# Patient Record
Sex: Male | Born: 1960
Health system: Southern US, Community
[De-identification: ages and names within clinical notes are randomized; demographics above are authoritative.]

## PROBLEM LIST (undated history)

## (undated) DIAGNOSIS — I10 Essential (primary) hypertension: Secondary | ICD-10-CM

---

## 2019-10-01 ENCOUNTER — Other Ambulatory Visit: Payer: Self-pay

## 2019-10-01 ENCOUNTER — Emergency Department (HOSPITAL_COMMUNITY)
Admission: EM | Admit: 2019-10-01 | Discharge: 2019-10-01 | Disposition: A | Discharge status: Home / Self Care | Payer: Medicaid - Out of State | Attending: Emergency Medicine | Admitting: Emergency Medicine

## 2019-10-01 ENCOUNTER — Emergency Department (HOSPITAL_COMMUNITY): Payer: Medicaid - Out of State

## 2019-10-01 DIAGNOSIS — T50901A Poisoning by unspecified drugs, medicaments and biological substances, accidental (unintentional), initial encounter: Secondary | ICD-10-CM | POA: Insufficient documentation

## 2019-10-01 DIAGNOSIS — J189 Pneumonia, unspecified organism: Secondary | ICD-10-CM | POA: Insufficient documentation

## 2019-10-01 DIAGNOSIS — R4182 Altered mental status, unspecified: Secondary | ICD-10-CM | POA: Diagnosis present

## 2019-10-01 LAB — CBC
HCT: 45.4 % (ref 39.0–52.0)
Hemoglobin: 14.7 g/dL (ref 13.0–17.0)
MCH: 31.1 pg (ref 26.0–34.0)
MCHC: 32.4 g/dL (ref 30.0–36.0)
MCV: 96.2 fL (ref 80.0–100.0)
Platelets: 269 10*3/uL (ref 150–400)
RBC: 4.72 MIL/uL (ref 4.22–5.81)
RDW: 12.1 % (ref 11.5–15.5)
WBC: 9.6 10*3/uL (ref 4.0–10.5)
nRBC: 0 % (ref 0.0–0.2)

## 2019-10-01 LAB — COMPREHENSIVE METABOLIC PANEL
ALT: 13 U/L (ref 0–44)
AST: 16 U/L (ref 15–41)
Albumin: 3.9 g/dL (ref 3.5–5.0)
Alkaline Phosphatase: 66 U/L (ref 38–126)
Anion gap: 11 (ref 5–15)
BUN: 14 mg/dL (ref 6–20)
CO2: 24 mmol/L (ref 22–32)
Calcium: 9.5 mg/dL (ref 8.9–10.3)
Chloride: 103 mmol/L (ref 98–111)
Creatinine, Ser: 1.28 mg/dL — ABNORMAL HIGH (ref 0.61–1.24)
GFR calc Af Amer: >60 mL/min (ref >60)
GFR calc non Af Amer: >60 mL/min (ref >60)
Glucose, Bld: 163 mg/dL — ABNORMAL HIGH (ref 70–99)
Potassium: 3.7 mmol/L (ref 3.5–5.1)
Sodium: 138 mmol/L (ref 135–145)
Total Bilirubin: 0.5 mg/dL (ref 0.3–1.2)
Total Protein: 7.6 g/dL (ref 6.5–8.1)

## 2019-10-01 LAB — RAPID URINE DRUG SCREEN, HOSP PERFORMED
Amphetamines: NOT DETECTED
Barbiturates: NOT DETECTED
Benzodiazepines: NOT DETECTED
Cocaine: POSITIVE — AB
Opiates: NOT DETECTED
Tetrahydrocannabinol: POSITIVE — AB

## 2019-10-01 LAB — ETHANOL: Alcohol, Ethyl (B): <10 mg/dL (ref <10)

## 2019-10-01 MED ORDER — AMOXICILLIN-POT CLAVULANATE 875-125 MG PO TABS
1.0000 | ORAL_TABLET | Freq: Once | ORAL | Status: AC
  Administered 2019-10-01: 1 via ORAL
  Filled 2019-10-01: qty 1

## 2019-10-01 MED ORDER — AMOXICILLIN-POT CLAVULANATE 875-125 MG PO TABS: 1.0000 | ORAL_TABLET | Freq: Two times a day (BID) | ORAL | 0 refills | Status: DC

## 2019-10-01 MED ORDER — LORAZEPAM 2 MG/ML IJ SOLN
2.0000 mg | Freq: Once | INTRAMUSCULAR | Status: AC
  Administered 2019-10-01: 2 mg via INTRAVENOUS
  Filled 2019-10-01: qty 1

## 2019-10-01 MED ORDER — LACTATED RINGERS IV BOLUS
1000.0000 mL | Freq: Once | INTRAVENOUS | Status: AC
  Administered 2019-10-01: 1000 mL via INTRAVENOUS

## 2019-10-01 MED ORDER — SODIUM CHLORIDE 0.9 % IV BOLUS: 1000.0000 mL | Freq: Once | INTRAVENOUS | Status: DC

## 2019-10-01 NOTE — Discharge Instructions (Signed)
You have cocaine and marijuana in your system. Avoid doing drugs or alcohol   You have small pneumonia on your xray. Take augmentin twice daily for a week   See your doctor   Return to ER if you have trouble breathing, lethargy, thoughts of harming yourself or others.

## 2019-10-01 NOTE — ED Triage Notes (Addendum)
Pt arrived via [facility] from [facility] where staff reported pt acting abnormal. Pt admitted elicit drug use this morning at 0700 but was unsure what he took. Pt referred to the drugs as "dog food" and stated it was "multiple types of pills that were crushed together." Pt denied SI/HI. Pt visiting from [place] and states he was awaiting bus back to home. Pt is alert and able to answer questions appropriately. Pt is very active and unable to remain still for long. No aggression noted at this time. EMS reported HR of 140 on scene. Other V/s stable. Pt denies chest pain/SOB at time of triage.

## 2019-10-01 NOTE — ED Provider Notes (Signed)
  Physical Exam  BP 116/79   Pulse 93   Temp 98.6 F (37 C) (Oral)   Resp (!) 9   SpO2 96%   Physical Exam  ED Course/Procedures     Procedures  MDM  Patient care assumed at 3 pm. Patient took some white substance from someone else and was starting to be agitated.  Patient received some Ativan in the ED and UDS is pending and pending reassessment of mental status.  5:23 PM Briefly desat to 88% when sleeping. Now up and talking and awake. CXR showed possible pneumonia. Likely aspirated. Afebrile, WBC nl. Has no insurance. Will give augmentin empirically. Doesn't need admission as his oxygen is normal when he is awake now    Drenda Freeze, MD 10/01/19 1724

## 2019-10-01 NOTE — ED Provider Notes (Signed)
[facility] EMERGENCY DEPARTMENT Provider Note   CSN: [id number] Arrival date & time: 10/01/19  1233     History   Chief Complaint Chief Complaint  Patient presents with  . Altered Mental Status    HPI Matthew Sanders is a 58 y.o. male.     HPI  58 year old male presents with drug intoxication.  He states that at around 7 AM he was given different medicines that he crushed and ingested.  He does not know what they are.  He states he is not been eating and drinking well over the last couple days and has been doing drugs given to him by other people.  He is here visiting family and set to go back to [place].  EMS was called because he was acting strange.  The patient denies alcohol use.  Is vague about other drug use.  Denies any suicidal attempts.  No past medical history on file.  There are no active problems to display for this patient.         Home Medications    Prior to Admission medications   Not on File    Family History No family history on file.  Social History Social History   Tobacco Use  . Smoking status: Not on file  Substance Use Topics  . Alcohol use: Not on file  . Drug use: Not on file     Allergies   Patient has no allergy information on record.   Review of Systems Review of Systems  Respiratory: Negative for shortness of breath.   Cardiovascular: Negative for chest pain.  Gastrointestinal: Negative for vomiting.  Psychiatric/Behavioral: Negative for suicidal ideas.  All other systems reviewed and are negative.    Physical Exam Updated Vital Signs BP 97/85   Pulse (!) 111   Temp 98.6 F (37 C) (Oral)   Resp 10   SpO2 94%   Physical Exam Vitals signs and nursing note reviewed.  Constitutional:      Appearance: He is well-developed.     Comments: Patient is restless. Frequently gets up and moves around bed  HENT:     Head: Normocephalic and atraumatic.     Right Ear: External ear normal.     Left Ear:  External ear normal.     Nose: Nose normal.  Eyes:     General:        Right eye: No discharge.        Left eye: No discharge.  Neck:     Musculoskeletal: Neck supple.  Cardiovascular:     Rate and Rhythm: Regular rhythm. Tachycardia present.     Heart sounds: Normal heart sounds.  Pulmonary:     Effort: Pulmonary effort is normal.     Breath sounds: Normal breath sounds.  Abdominal:     Palpations: Abdomen is soft.     Tenderness: There is no abdominal tenderness.  Skin:    General: Skin is warm and dry.  Neurological:     Mental Status: He is alert and oriented to person, place, and time.     Comments: CN 3-12 grossly intact. 5/5 strength in all 4 extremities. Grossly normal sensation. Normal finger to nose.   Psychiatric:        Mood and Affect: Mood is anxious.      ED Treatments / Results  Labs (all labs ordered are listed, but only abnormal results are displayed) Labs Reviewed  COMPREHENSIVE METABOLIC PANEL - Abnormal; Notable for the following components:  Result Value   Glucose, Bld 163 (*)    Creatinine, Ser 1.28 (*)    All other components within normal limits  RAPID URINE DRUG SCREEN, HOSP PERFORMED - Abnormal; Notable for the following components:   Cocaine POSITIVE (*)    Tetrahydrocannabinol POSITIVE (*)    All other components within normal limits  ETHANOL  CBC  CBG MONITORING, ED    EKG EKG Interpretation  Date/Time:  Tuesday October 01 2019 12:46:51 EST Ventricular Rate:  94 PR Interval:    QRS Duration: 88 QT Interval:  360 QTC Calculation: 451 R Axis:   -42 Text Interpretation: Sinus rhythm Probable left atrial enlargement Abnormal R-wave progression, late transition Inferior infarct, old No old tracing to compare Confirmed by Pricilla Loveless [phone]) on 10/01/2019 1:04:49 PM   Radiology No results found.  Procedures Procedures (including critical care time)  Medications Ordered in ED Medications  LORazepam (ATIVAN) injection 2  mg (2 mg Intravenous Given 10/01/19 1339)  lactated ringers bolus 1,000 mL (1,000 mLs Intravenous New Bag/Given 10/01/19 1345)  lactated ringers bolus 1,000 mL (1,000 mLs Intravenous New Bag/Given 10/01/19 1342)     Initial Impression / Assessment and Plan / ED Course  I have reviewed the triage vital signs and the nursing notes.  Pertinent labs & imaging results that were available during my care of the patient were reviewed by me and considered in my medical decision making (see chart for details).        Patient is intoxicated.  While he is oriented to person, place, time and situation, he clearly is having some side effects of the drugs that he ingested this morning and possibly over the last couple days.  He will be given fluids and he was given IV Ativan.  His vital signs have improved with this though he is now asleep.  He will need reevaluation when sober, but given he is not truly altered or having focal neuro deficits, I do not think CT head is needed.  Care to Dr. Silverio Lay.  Final Clinical Impressions(s) / ED Diagnoses   Final diagnoses:  None    ED Discharge Orders    None       Pricilla Loveless, MD 10/01/19 1515

## 2019-10-01 NOTE — ED Notes (Signed)
Pt is calm, placed on [place] 4L after ativan

## 2019-10-01 NOTE — ED Notes (Signed)
Pt continues to sleep.  Pt continues to have 4L [place] as his spo2 is 88% on RA (? Due to low RR while sleeping?)

## 2019-10-17 ENCOUNTER — Emergency Department (HOSPITAL_COMMUNITY): Payer: Medicaid - Out of State

## 2019-10-17 ENCOUNTER — Emergency Department (HOSPITAL_COMMUNITY)
Admission: EM | Admit: 2019-10-17 | Discharge: 2019-10-17 | Disposition: A | Discharge status: Home / Self Care | Payer: Medicaid - Out of State | Attending: Emergency Medicine | Admitting: Emergency Medicine

## 2019-10-17 DIAGNOSIS — K429 Umbilical hernia without obstruction or gangrene: Secondary | ICD-10-CM

## 2019-10-17 DIAGNOSIS — G8929 Other chronic pain: Secondary | ICD-10-CM

## 2019-10-17 DIAGNOSIS — R030 Elevated blood-pressure reading, without diagnosis of hypertension: Secondary | ICD-10-CM

## 2019-10-17 DIAGNOSIS — M5442 Lumbago with sciatica, left side: Secondary | ICD-10-CM | POA: Diagnosis not present

## 2019-10-17 DIAGNOSIS — K6289 Other specified diseases of anus and rectum: Secondary | ICD-10-CM | POA: Diagnosis not present

## 2019-10-17 DIAGNOSIS — M545 Low back pain: Secondary | ICD-10-CM | POA: Diagnosis present

## 2019-10-17 LAB — COMPREHENSIVE METABOLIC PANEL
ALT: 11 U/L (ref 0–44)
AST: 13 U/L — ABNORMAL LOW (ref 15–41)
Albumin: 4.1 g/dL (ref 3.5–5.0)
Alkaline Phosphatase: 85 U/L (ref 38–126)
Anion gap: 11 (ref 5–15)
BUN: 20 mg/dL (ref 6–20)
CO2: 23 mmol/L (ref 22–32)
Calcium: 9.5 mg/dL (ref 8.9–10.3)
Chloride: 106 mmol/L (ref 98–111)
Creatinine, Ser: 1.07 mg/dL (ref 0.61–1.24)
GFR calc Af Amer: >60 mL/min (ref >60)
GFR calc non Af Amer: >60 mL/min (ref >60)
Glucose, Bld: 112 mg/dL — ABNORMAL HIGH (ref 70–99)
Potassium: 4.1 mmol/L (ref 3.5–5.1)
Sodium: 140 mmol/L (ref 135–145)
Total Bilirubin: 0.7 mg/dL (ref 0.3–1.2)
Total Protein: 7.9 g/dL (ref 6.5–8.1)

## 2019-10-17 LAB — CBC WITH DIFFERENTIAL/PLATELET
Abs Immature Granulocytes: 0.03 10*3/uL (ref 0.00–0.07)
Basophils Absolute: 0.1 10*3/uL (ref 0.0–0.1)
Basophils Relative: 1 %
Eosinophils Absolute: 0.1 10*3/uL (ref 0.0–0.5)
Eosinophils Relative: 2 %
HCT: 47.5 % (ref 39.0–52.0)
Hemoglobin: 16.2 g/dL (ref 13.0–17.0)
Immature Granulocytes: 0 %
Lymphocytes Relative: 26 %
Lymphs Abs: 2.3 10*3/uL (ref 0.7–4.0)
MCH: 31.6 pg (ref 26.0–34.0)
MCHC: 34.1 g/dL (ref 30.0–36.0)
MCV: 92.6 fL (ref 80.0–100.0)
Monocytes Absolute: 0.6 10*3/uL (ref 0.1–1.0)
Monocytes Relative: 7 %
Neutro Abs: 5.5 10*3/uL (ref 1.7–7.7)
Neutrophils Relative %: 64 %
Platelets: 267 10*3/uL (ref 150–400)
RBC: 5.13 MIL/uL (ref 4.22–5.81)
RDW: 12.5 % (ref 11.5–15.5)
WBC: 8.7 10*3/uL (ref 4.0–10.5)
nRBC: 0 % (ref 0.0–0.2)

## 2019-10-17 LAB — LIPASE, BLOOD: Lipase: 29 U/L (ref 11–51)

## 2019-10-17 MED ORDER — HYDROXYZINE HCL 25 MG PO TABS
25.0000 mg | ORAL_TABLET | Freq: Once | ORAL | Status: AC
  Administered 2019-10-17: 25 mg via ORAL
  Filled 2019-10-17: qty 1

## 2019-10-17 MED ORDER — MELOXICAM 7.5 MG PO TABS: 7.5000 mg | ORAL_TABLET | Freq: Every day | ORAL | 0 refills | Status: AC

## 2019-10-17 MED ORDER — LIDOCAINE 5 % EX PTCH
2.0000 | MEDICATED_PATCH | CUTANEOUS | Status: DC
  Administered 2019-10-17: 10:00:00 2 via TRANSDERMAL
  Filled 2019-10-17: qty 2

## 2019-10-17 MED ORDER — IOHEXOL 350 MG/ML SOLN
100.0000 mL | Freq: Once | INTRAVENOUS | Status: AC | PRN
  Administered 2019-10-17: 100 mL via INTRAVENOUS

## 2019-10-17 MED ORDER — HYDROCODONE-ACETAMINOPHEN 5-325 MG PO TABS
1.0000 | ORAL_TABLET | Freq: Once | ORAL | Status: AC
  Administered 2019-10-17: 1 via ORAL
  Filled 2019-10-17: qty 1

## 2019-10-17 MED ORDER — METHOCARBAMOL 500 MG PO TABS: 500.0000 mg | ORAL_TABLET | Freq: Two times a day (BID) | ORAL | 0 refills | Status: AC

## 2019-10-17 NOTE — ED Notes (Signed)
Patient transported to X-ray 

## 2019-10-17 NOTE — Progress Notes (Signed)
  [place] Medication Assistance Card  Name: Matthew Sanders  ID (MRN): [id number] [place]: [id number] RX Group: [id number]  Discharge Date:10/17/2019  5:18 PM  Expiration Date:10/25/2019  (must be filled within 7 days of discharge)     Dear [name]:  You have been approved to have the prescriptions written by your discharging physician filled through our [facility] (Medication Assistance Through [facility]) program. This program allows for a one-time (no refills) 34-day supply of selected medications for a low copay amount.  The copay is $3.00 per prescription. For instance, if you have one prescription, you will pay $3.00; for two prescriptions, you pay $6.00; for three prescriptions, you pay $9.00; and so on.  Only certain pharmacies are participating in this program with [facility]. You will need to select one of the pharmacies from the attached list and take your prescriptions, this letter, and your photo ID to one of the participating pharmacies.   We are excited that you are able to use the [facility] program to get your medications. These prescriptions must be filled within 7 days of hospital discharge or they will no longer be valid for the [facility] program. Should you have any problems with your prescriptions please contact your case management team member at [phone] for [name],  Lizzett Nobile CM [place]

## 2019-10-17 NOTE — Discharge Instructions (Addendum)
As discussed, your imaging today showed a hernia and rectal mass. I have included 3 doctor numbers in the paperwork for further evaluation of your low back pain, hernia, and rectal mass. Call tomorrow to schedule an appointment at each of these placed for further workup. I am sending you home with meloxicam which is a pain medication. You may take it once a day as needed for pain. Do not mix with other over the counter medications. I am also sending you home with Robaxin which is a muscle relaxer. The medication can cause drowsiness, so do not drive or operate machinery while on this medication. You have an appointment schedule for a PCP on 1/13 at 9:30am. Information of the location is in this packet. Return to the ER for new or worsening symptoms.   Also, your blood pressure was elevated today. Please get your blood pressure rechecked at your schedule PCP appointment. You may need to be placed on high blood pressure medication

## 2019-10-17 NOTE — ED Triage Notes (Signed)
Pt to ER - requesting xray of his mid-thoracic back for social security reasons. He reports has had this pain for years, no changes recently. He is ambulatory with cane at baseline. He is a/o x4. NAD at triage.

## 2019-10-17 NOTE — ED Notes (Signed)
Patient transported to CT 

## 2019-10-17 NOTE — Progress Notes (Addendum)
TOC CM spoke to pt and states he is currently homeless. Will provide M[facility] with $0 copay for his meds. Explained to pt that he can use once per year. Appt arranged at [facility] on 11/13/2019 at 9:30 am. Pt states he uses public transportation. Will provide pt with information on DSS to have his Medicaid changed to [place]. States he has pending disability applicaton. Jonnie Finner RN CCM, [facility] ED TOC CM [phone]  Faxed MATCH to [place]. Jonnie Finner RN CCM, [facility] ED TOC CM [phone]  10/18/2019 316 pm Pt picked up medications. Explained the importance of keeping his follow up appt. [place], [place] ED TOC CM [phone]

## 2019-10-17 NOTE — ED Notes (Signed)
Pt given food and beverage per [place], [place]
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
## 2019-10-17 NOTE — Progress Notes (Signed)
CSW received a call from pt's EDP stating pt is living in a shelter currently, is from [place] and is new to the area.  Per EDP, pt has significant health issues and may need follow up assistance with appts, etc, per the EDP.  CSW to request assistance from [facility] RN CM.  CSW will continue to follow for D/C needs.  Alphonse Guild. Levoy Geisen, LCSW, LCAS, CSI Transitions of Care Clinical Social Worker Care Coordination Department Ph: [phone]

## 2019-10-17 NOTE — ED Notes (Signed)
Patient verbalizes understanding of discharge instructions. Opportunity for questioning and answers were provided. Pt discharged from ED. 

## 2019-10-17 NOTE — ED Notes (Signed)
At pt's request, [facility] notified that pt would be discharged and returning to facility this evening

## 2019-10-17 NOTE — ED Notes (Signed)
At pt's request, [facility] to notify them that he is in ED

## 2019-10-18 ENCOUNTER — Encounter (HOSPITAL_COMMUNITY): Payer: Self-pay | Admitting: Student

## 2019-10-23 ENCOUNTER — Encounter: Payer: Self-pay | Admitting: Critical Care Medicine

## 2019-10-23 ENCOUNTER — Other Ambulatory Visit: Payer: Self-pay | Admitting: Critical Care Medicine

## 2019-10-23 ENCOUNTER — Encounter: Payer: Self-pay | Admitting: Physician Assistant

## 2019-10-23 DIAGNOSIS — K922 Gastrointestinal hemorrhage, unspecified: Secondary | ICD-10-CM

## 2019-10-23 DIAGNOSIS — K649 Unspecified hemorrhoids: Secondary | ICD-10-CM

## 2019-10-23 DIAGNOSIS — K429 Umbilical hernia without obstruction or gangrene: Secondary | ICD-10-CM

## 2019-10-23 MED ORDER — HYDROCORTISONE ACETATE 25 MG RE SUPP: 25.0000 mg | Freq: Two times a day (BID) | RECTAL | 0 refills | Status: AC

## 2019-10-23 MED FILL — HYDROCORTISONE ACETATE 25 M: 25 | 12 days supply | Qty: 24 | Fill #0

## 2019-10-23 NOTE — Progress Notes (Signed)
anusol for hemorrhoids
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
## 2019-10-30 ENCOUNTER — Encounter: Payer: Self-pay | Admitting: Critical Care Medicine

## 2019-10-31 NOTE — Progress Notes (Signed)
Patient ID: Randolf Sansoucie, male   DOB: Jul 11, 1961, 58 y.o.   MRN: [id number] Connected with this patient but seen a week ago in the [place]  cl[facility].  This is a 58 year old male whose had issues of pain and bleeding in the rectal area.  A CT scan recently was done which showed shouldering effect in the rectosigmoid area.  On my exam previously there were no external hemorrhoids visible.  He was received an Anusol suppository which I gave him last week he states this is helped significantly.  Blood pressure today 130/96.  The patient states his low back pain is improved he does complain of some hand numbness.  He still has the meloxicam and the Robaxin which he takes as needed.  I had made an appointment for this patient at [facility] surgery and they are going to reach out to the patient I told the patient to be on the look out for phone call for an upcoming appointment with [facility] surgery with Dr. Ninfa Linden

## 2019-11-13 ENCOUNTER — Ambulatory Visit (INDEPENDENT_AMBULATORY_CARE_PROVIDER_SITE_OTHER): Payer: Medicaid - Out of State | Admitting: Primary Care

## 2019-11-13 ENCOUNTER — Encounter (INDEPENDENT_AMBULATORY_CARE_PROVIDER_SITE_OTHER): Payer: Self-pay | Admitting: Primary Care

## 2019-11-13 ENCOUNTER — Other Ambulatory Visit: Payer: Self-pay

## 2019-11-13 DIAGNOSIS — K4030 Unilateral inguinal hernia, with obstruction, without gangrene, not specified as recurrent: Secondary | ICD-10-CM

## 2019-11-13 DIAGNOSIS — Z7689 Persons encountering health services in other specified circumstances: Secondary | ICD-10-CM

## 2019-11-13 NOTE — Progress Notes (Signed)
Virtual Visit via Telephone Note  I connected with Matthew Sanders on 11/13/19 at  9:30 AM EST by telephone and verified that I am speaking with the correct person using two identifiers.   I discussed the limitations, risks, security and privacy concerns of performing an evaluation and management service by telephone and the availability of in person appointments. I also discussed with the patient that there may be a patient responsible charge related to this service. The patient expressed understanding and agreed to proceed.   History of Present Illness: Matthew Sanders is having tele visit to establish care, he has a hernia that is painful and would like something done. He is very emotional death in family he was talking back and fourth from hernia to the grand kids finding grandma died because the fire alarm went off biscuits she was baking was burning than to pain.  No past medical history on file.  Current Outpatient Medications on File Prior to Visit  Medication Sig Dispense Refill  . hydrocortisone (ANUSOL-HC) 25 MG suppository Place 1 suppository (25 mg total) rectally 2 (two) times daily. 24 suppository 0  . meloxicam (MOBIC) 7.5 MG tablet Take 1 tablet (7.5 mg total) by mouth daily. 15 tablet 0  . methocarbamol (ROBAXIN) 500 MG tablet Take 1 tablet (500 mg total) by mouth 2 (two) times daily. 20 tablet 0   No current facility-administered medications on file prior to visit.     Observations/Objective: Review of Systems  Gastrointestinal: Positive for abdominal pain.  All other systems reviewed and are negative.    Assessment and Plan: Matthew Sanders was seen today for hospitalization follow-up.  Diagnoses and all orders for this visit:  Encounter to establish care Gwinda Passe, NP-C will be your  (PCP) mastered prepared that is able to that will  diagnosed and treatment able to answer health concern as well as continuing care of varied medical conditions, not limited by cause,  organ system, or diagnosis.   Inguinal hernia with obstruction without gangrene, recurrence not specified, unspecified laterality Patient states he was working out lifting weights when he notices bulging in his stomach. Previous documentation refer to diagnosis as umbilical hernia Refer to general surgery to evaluate and treat.    Follow Up Instructions:    I discussed the assessment and treatment plan with the patient. The patient was provided an opportunity to ask questions and all were answered. The patient agreed with the plan and demonstrated an understanding of the instructions.   The patient was advised to call back or seek an in-person evaluation if the symptoms worsen or if the condition fails to improve as anticipated.  I provided 22 minutes of non-face-to-face time during this encounter. Includes reading previous notes, labs and imaging   Grayce Sessions, NP

## 2019-11-14 ENCOUNTER — Ambulatory Visit: Payer: Medicaid - Out of State | Admitting: Physician Assistant

## 2020-02-18 ENCOUNTER — Emergency Department (HOSPITAL_COMMUNITY): Payer: Medicaid Other

## 2020-02-18 ENCOUNTER — Encounter (HOSPITAL_COMMUNITY): Payer: Self-pay | Admitting: Emergency Medicine

## 2020-02-18 ENCOUNTER — Other Ambulatory Visit: Payer: Self-pay

## 2020-02-18 ENCOUNTER — Emergency Department (HOSPITAL_COMMUNITY)
Admission: EM | Admit: 2020-02-18 | Discharge: 2020-02-18 | Disposition: A | Discharge status: Home / Self Care | Payer: Medicaid Other | Attending: Emergency Medicine | Admitting: Emergency Medicine

## 2020-02-18 DIAGNOSIS — K429 Umbilical hernia without obstruction or gangrene: Secondary | ICD-10-CM | POA: Diagnosis not present

## 2020-02-18 DIAGNOSIS — S20211A Contusion of right front wall of thorax, initial encounter: Secondary | ICD-10-CM | POA: Diagnosis not present

## 2020-02-18 DIAGNOSIS — R0789 Other chest pain: Secondary | ICD-10-CM | POA: Diagnosis present

## 2020-02-18 DIAGNOSIS — Y999 Unspecified external cause status: Secondary | ICD-10-CM | POA: Diagnosis not present

## 2020-02-18 DIAGNOSIS — J90 Pleural effusion, not elsewhere classified: Secondary | ICD-10-CM | POA: Diagnosis not present

## 2020-02-18 DIAGNOSIS — W109XXA Fall (on) (from) unspecified stairs and steps, initial encounter: Secondary | ICD-10-CM | POA: Insufficient documentation

## 2020-02-18 DIAGNOSIS — G8911 Acute pain due to trauma: Secondary | ICD-10-CM | POA: Diagnosis not present

## 2020-02-18 DIAGNOSIS — Y9389 Activity, other specified: Secondary | ICD-10-CM | POA: Insufficient documentation

## 2020-02-18 DIAGNOSIS — R1033 Periumbilical pain: Secondary | ICD-10-CM | POA: Diagnosis not present

## 2020-02-18 DIAGNOSIS — Y929 Unspecified place or not applicable: Secondary | ICD-10-CM | POA: Insufficient documentation

## 2020-02-18 HISTORY — DX: Essential (primary) hypertension: I10

## 2020-02-18 LAB — COMPREHENSIVE METABOLIC PANEL
ALT: 13 U/L (ref 0–44)
AST: 14 U/L — ABNORMAL LOW (ref 15–41)
Albumin: 4.1 g/dL (ref 3.5–5.0)
Alkaline Phosphatase: 115 U/L (ref 38–126)
Anion gap: 10 (ref 5–15)
BUN: 13 mg/dL (ref 6–20)
CO2: 23 mmol/L (ref 22–32)
Calcium: 9.5 mg/dL (ref 8.9–10.3)
Chloride: 103 mmol/L (ref 98–111)
Creatinine, Ser: 0.76 mg/dL (ref 0.61–1.24)
GFR calc Af Amer: >60 mL/min (ref >60)
GFR calc non Af Amer: >60 mL/min (ref >60)
Glucose, Bld: 125 mg/dL — ABNORMAL HIGH (ref 70–99)
Potassium: 3.7 mmol/L (ref 3.5–5.1)
Sodium: 136 mmol/L (ref 135–145)
Total Bilirubin: 0.5 mg/dL (ref 0.3–1.2)
Total Protein: 7.9 g/dL (ref 6.5–8.1)

## 2020-02-18 LAB — LIPASE, BLOOD: Lipase: 20 U/L (ref 11–51)

## 2020-02-18 LAB — URINALYSIS, ROUTINE W REFLEX MICROSCOPIC
Bacteria, UA: NONE SEEN
Bilirubin Urine: NEGATIVE
Glucose, UA: NEGATIVE mg/dL
Hgb urine dipstick: NEGATIVE
Ketones, ur: 5 mg/dL — AB
Nitrite: NEGATIVE
Protein, ur: NEGATIVE mg/dL
Specific Gravity, Urine: 1.016 (ref 1.005–1.030)
pH: 8 (ref 5.0–8.0)

## 2020-02-18 LAB — CBC
HCT: 49.7 % (ref 39.0–52.0)
Hemoglobin: 16.3 g/dL (ref 13.0–17.0)
MCH: 30.9 pg (ref 26.0–34.0)
MCHC: 32.8 g/dL (ref 30.0–36.0)
MCV: 94.1 fL (ref 80.0–100.0)
Platelets: 259 10*3/uL (ref 150–400)
RBC: 5.28 MIL/uL (ref 4.22–5.81)
RDW: 12.9 % (ref 11.5–15.5)
WBC: 7.7 10*3/uL (ref 4.0–10.5)
nRBC: 0 % (ref 0.0–0.2)

## 2020-02-18 LAB — CBG MONITORING, ED: Glucose-Capillary: 104 mg/dL — ABNORMAL HIGH (ref 70–99)

## 2020-02-18 MED ORDER — TRAMADOL HCL 50 MG PO TABS: 100.0000 mg | ORAL_TABLET | Freq: Four times a day (QID) | ORAL | 0 refills | Status: AC | PRN

## 2020-02-18 MED ORDER — IOHEXOL 300 MG/ML  SOLN
100.0000 mL | Freq: Once | INTRAMUSCULAR | Status: AC | PRN
  Administered 2020-02-18: 100 mL via INTRAVENOUS

## 2020-02-18 MED ORDER — OXYCODONE HCL 5 MG PO TABS
5.0000 mg | ORAL_TABLET | Freq: Once | ORAL | Status: AC
  Administered 2020-02-18: 5 mg via ORAL
  Filled 2020-02-18: qty 1

## 2020-02-18 MED ORDER — AZITHROMYCIN 250 MG PO TABS: 250.0000 mg | ORAL_TABLET | Freq: Every day | ORAL | 0 refills | Status: AC

## 2020-02-18 MED ORDER — IBUPROFEN 800 MG PO TABS: 800.0000 mg | ORAL_TABLET | Freq: Three times a day (TID) | ORAL | 0 refills | Status: AC

## 2020-02-18 NOTE — Discharge Instructions (Signed)
1. You have some fluid by the lung on the right side. You described falling several weeks ago. You are being given an antibiotic to take as well as ibuprofen and tramadol for pain. Take the 800 mg ibuprofen tablet with some food 3 times a day for the next 3 to 5 days. You may also take the tramadol every 6 hours if needed for additional pain control. 2. It is very important that you have a recheck to make sure that this fluid resolves with treatment and some additional time. If it is not going away, you will need additional testing to make sure there is not another serious cause. 3. You have an umbilical hernia. There is a small amount of fat that protrudes through the muscle wall of the abdomen. This is not a dangerous condition but if you are having significant pain it can be repaired by a Careers adviser. This will require outpatient evaluation by a surgeon and scheduling of a time. 4. You advised that you do not have financial resources for additional medical care. A resource guide has been included in your discharge instructions that contains many low cost or free options for medical care. Start to make phone calls and arrange a follow-up appointment within the next 1 to 2 weeks. 5. Return to the emergency department if you have worsening or changing symptoms.

## 2020-02-18 NOTE — ED Triage Notes (Signed)
Pt endorses not feeling well. States he fell a few weeks ago and has broken ribs. Also complaining of abd pain and that he needs surgery on his hernia. Denies N/V/D. States he is homeless so he does not have his HTN meds.

## 2020-02-18 NOTE — ED Notes (Signed)
Pt asked this RN to remove IV. Pt informed he may still need IV. Pt then ripped out IV. Pt given gauze to apply pressure to site to control bleeding.

## 2020-02-18 NOTE — ED Notes (Signed)
Pt returned from CTx

## 2020-02-18 NOTE — ED Provider Notes (Signed)
CT for worsening hernia pain. If negative, anticipate D/C.  Patient describes 2 sources of pain. He reports that he fell on some stairs about 2 weeks ago. He reports that he "broke some ribs on the right side". He denies it or ever sought medical care for this fall but assumed he had some broken ribs. He reports that the right side of the chest remains sore with certain movements and it wraps around towards his upper abdomen. He denies he has had any fevers. He has not had any productive cough. No hemoptysis. He also reports he continues to have pain in his umbilical area. He has been seen previously for an umbilical hernia. He reports it never gets better.  Patient denies he has any medical problems. He reports he is otherwise healthy and does not take any medications regularly. He reports he has not gotten any medical care because he does not have insurance. Physical Exam  BP (!) 172/91 (BP Location: Right Arm)   Pulse 70   Temp (!) 97.4 F (36.3 C) (Oral)   Resp 18   Ht 5\' 9"  (1.753 m)   Wt 90.7 kg   SpO2 97%   BMI 29.53 kg/m   Physical Exam Patient is alert and appropriate. No confusion. No respiratory distress at rest. Well-nourished well-developed. Chest wall examined. There is no crepitus over the right side of the chest wall. No residual appearance of bruising or hematoma. No rashes or lesions. Patient does endorse tenderness to compression over the lateral right chest wall. He also endorses tenderness to the umbilicus with pressure. There is no erythema, induration, cellulitis or abscess formation in the umbilicus. Small hernia palpated within the abdominal fat. ED Course/Procedures     Procedures  MDM  Patient is clinically well in appearance. He had complaint of umbilical pain for which CT scan was obtained by Dr. . Hernia is stable compared to prior CT scan. On exam no evidence of cellulitis or abscess formation. Patient is advised that this will be an outpatient surgical  management issue. After evaluating the patient and noting that there was a effusion with some atelectasis on the right, this appears likely due to a fall he sustained 2 weeks ago. Patient reports it is still tender in the area. He assumed he had broken ribs but denies he ever sought treatment. No signs of respiratory distress or compromise. Will opt to treat with a course of Zithromax given the finding of atelectasis and per radiology cannot rule out infectious etiology. Will treat for pain with combination of ibuprofen and tramadol. Patient has been counseled on necessity for follow-up to monitor for resolution of the effusion seen on CT scan. He has been provided with the resource guide for low-cost medical care. Patient voices understanding and advises he will use the resource guide.       Pilar Plate, MD 02/18/20 [phone]

## 2020-02-20 LAB — URINE CULTURE

## 2020-04-05 IMAGING — CR DG THORACIC SPINE 2V
3 series · 3 of 3 positions shown · non-contrast
Comparison: None.

CLINICAL DATA: Chronic mid back pain without known injury.

EXAM:
THORACIC SPINE 2 VIEWS

[t-spine ap]
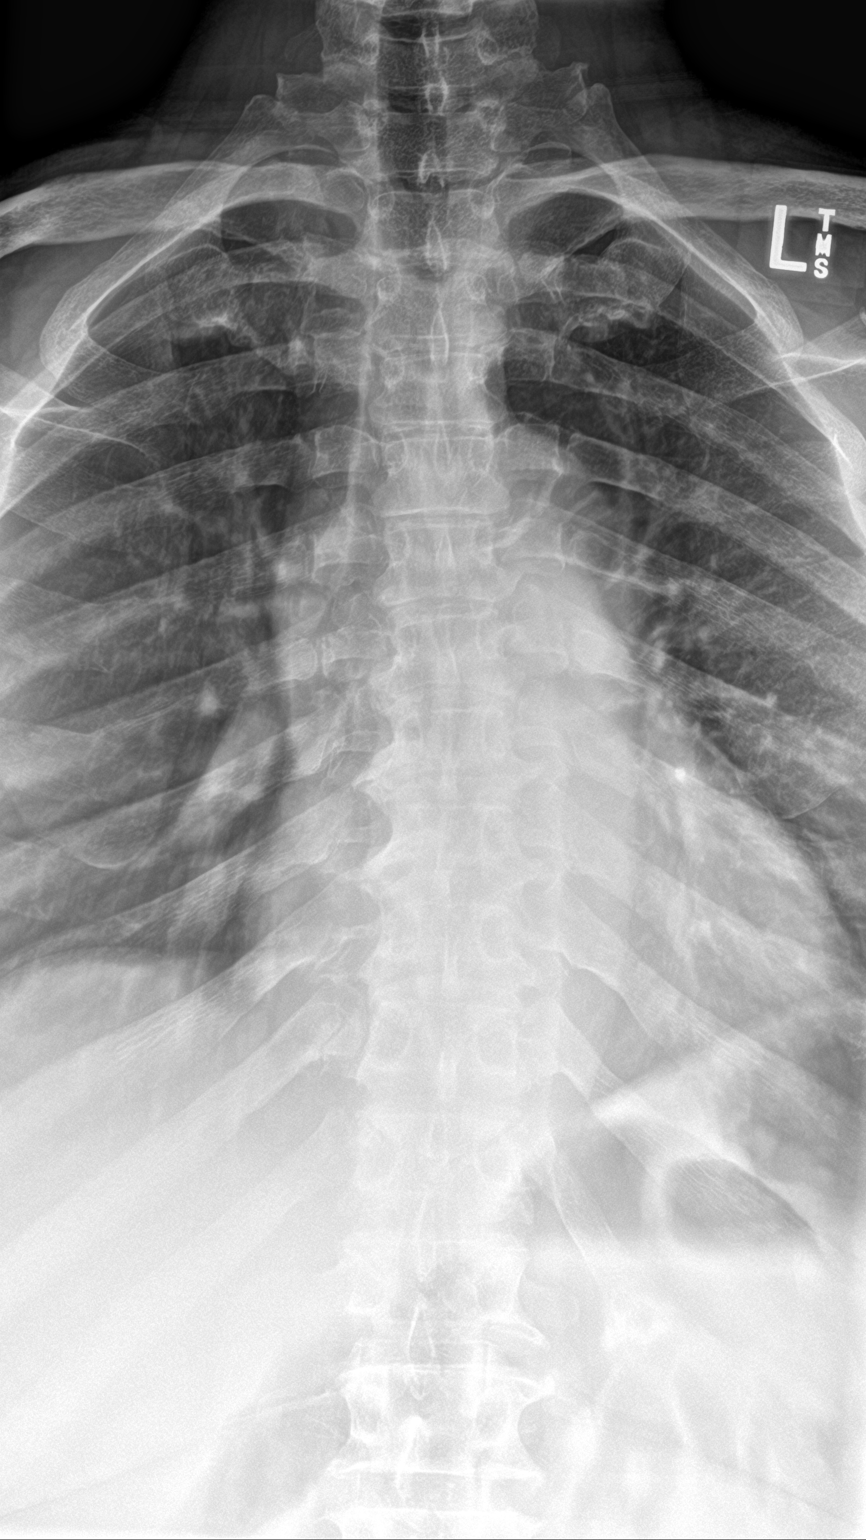

[t-spine lat]
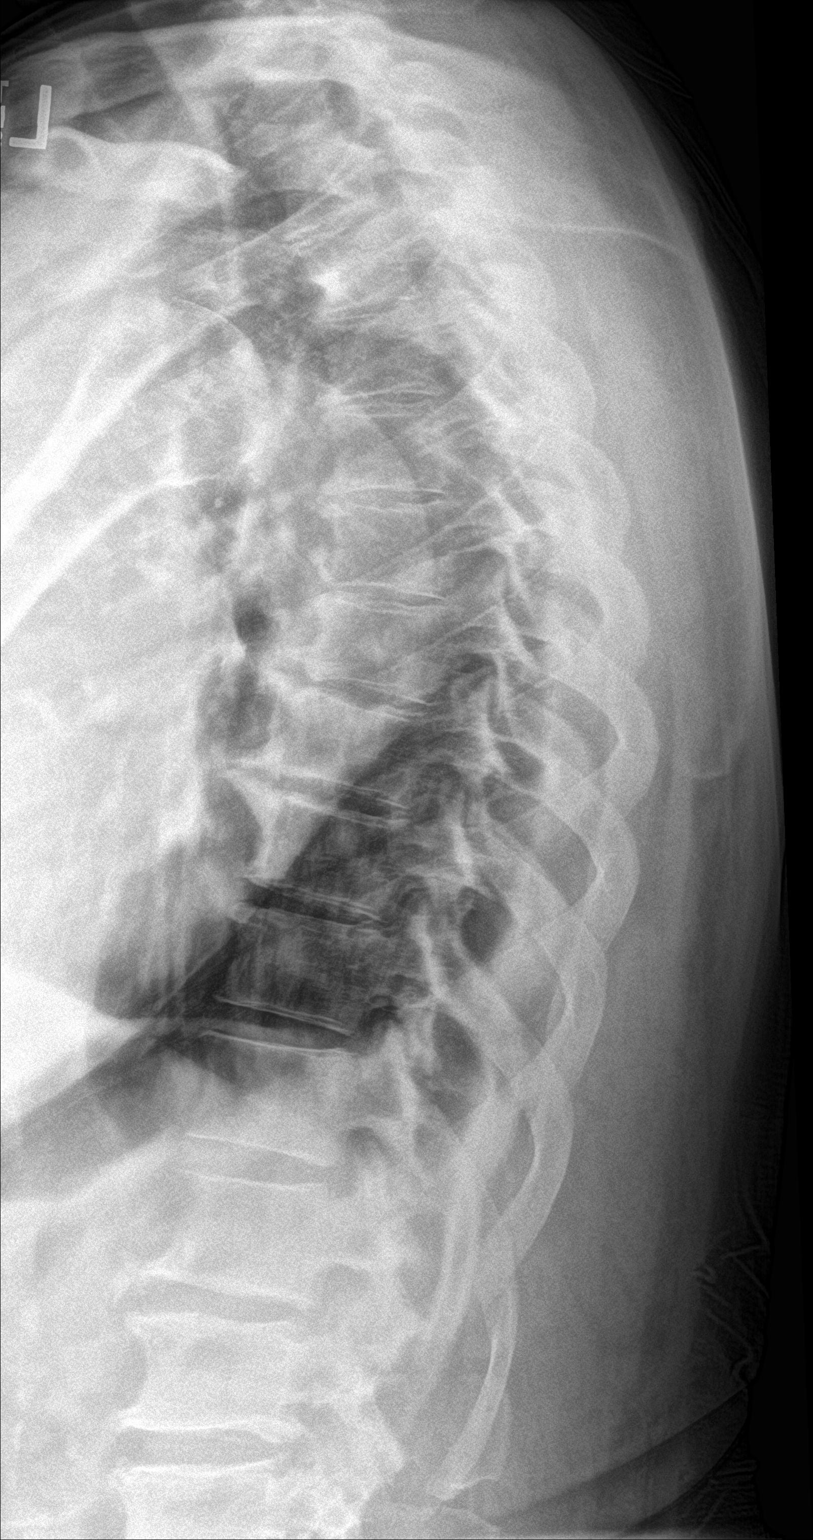

[t-spine swimmers]
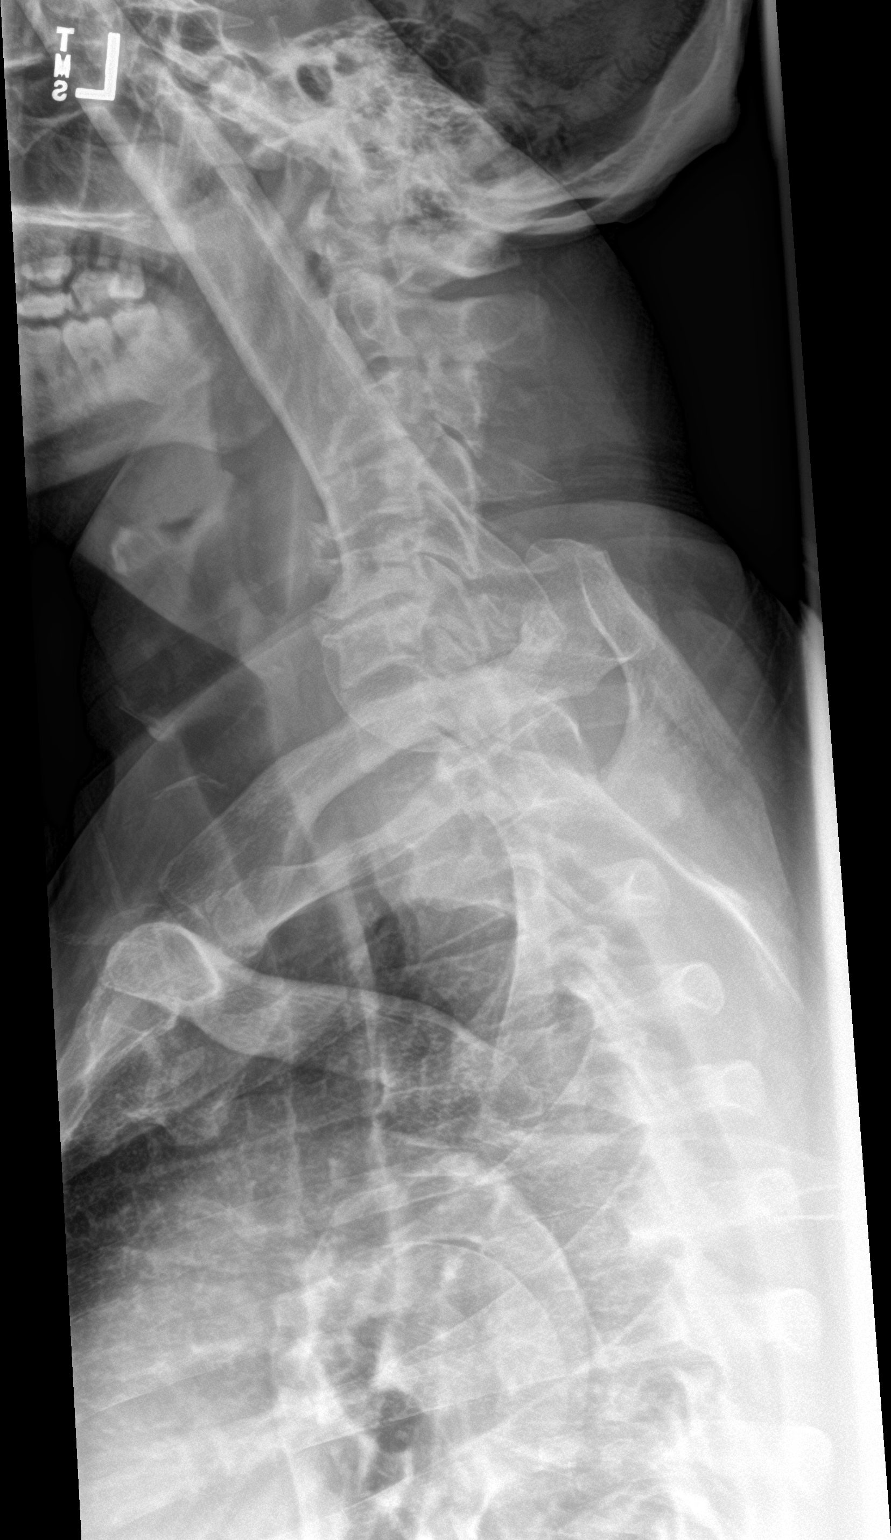

[3 of 3 positions shown; findings below may reference images not displayed]

FINDINGS: No fracture or spondylolisthesis is noted. Mild anterior osteophyte
formation is seen involving multiple disc spaces in the midthoracic
spine.
IMPRESSION: Mild degenerative changes are noted in the midthoracic spine. No
acute abnormality seen.

## 2020-04-05 IMAGING — CR DG LUMBAR SPINE COMPLETE 4+V
5 series · 5 of 5 positions shown · non-contrast
Comparison: None.
COMPARISON: None.

Addendum:
CLINICAL DATA: Chronic low back pain

EXAM:
LUMBAR SPINE - COMPLETE 4+ VIEW

[l-spine obl (1 of 2)]
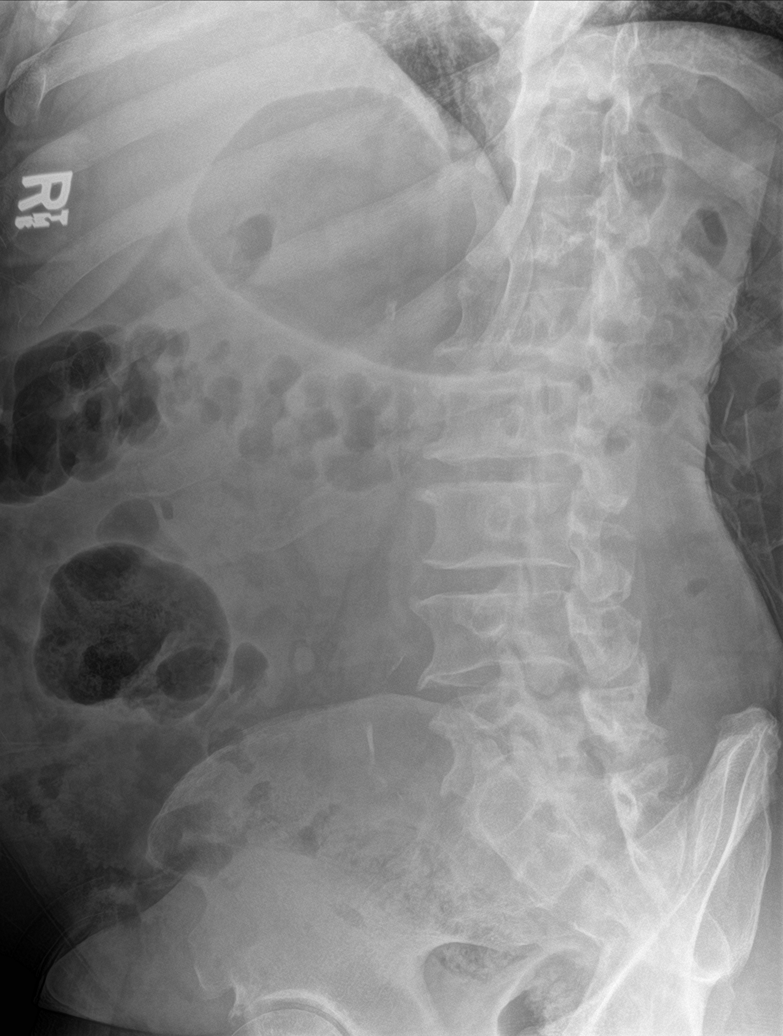

[l-spine obl (2 of 2)]
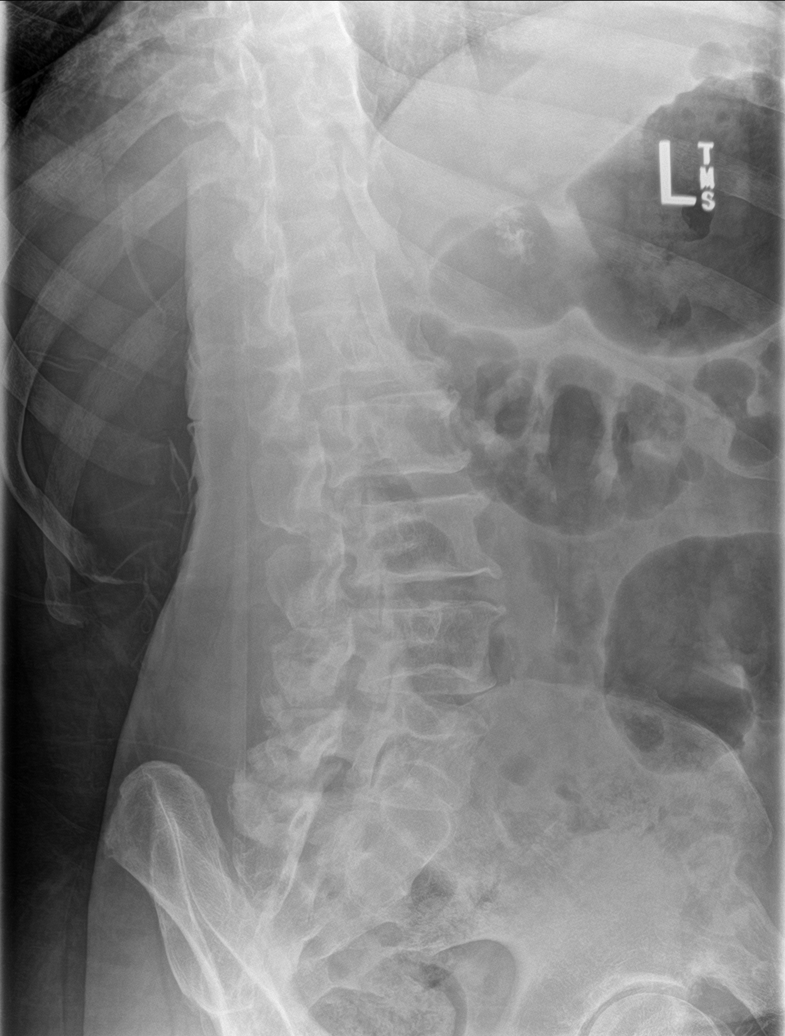

[l-spine lat]
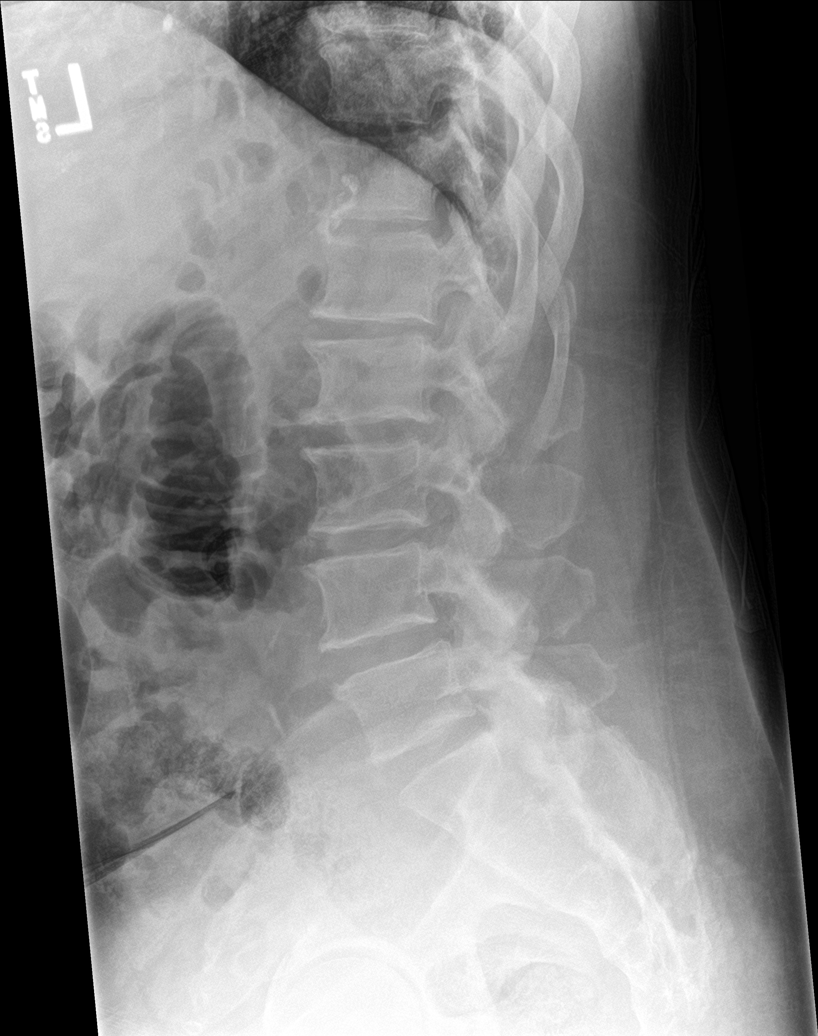

[l-spine spot]
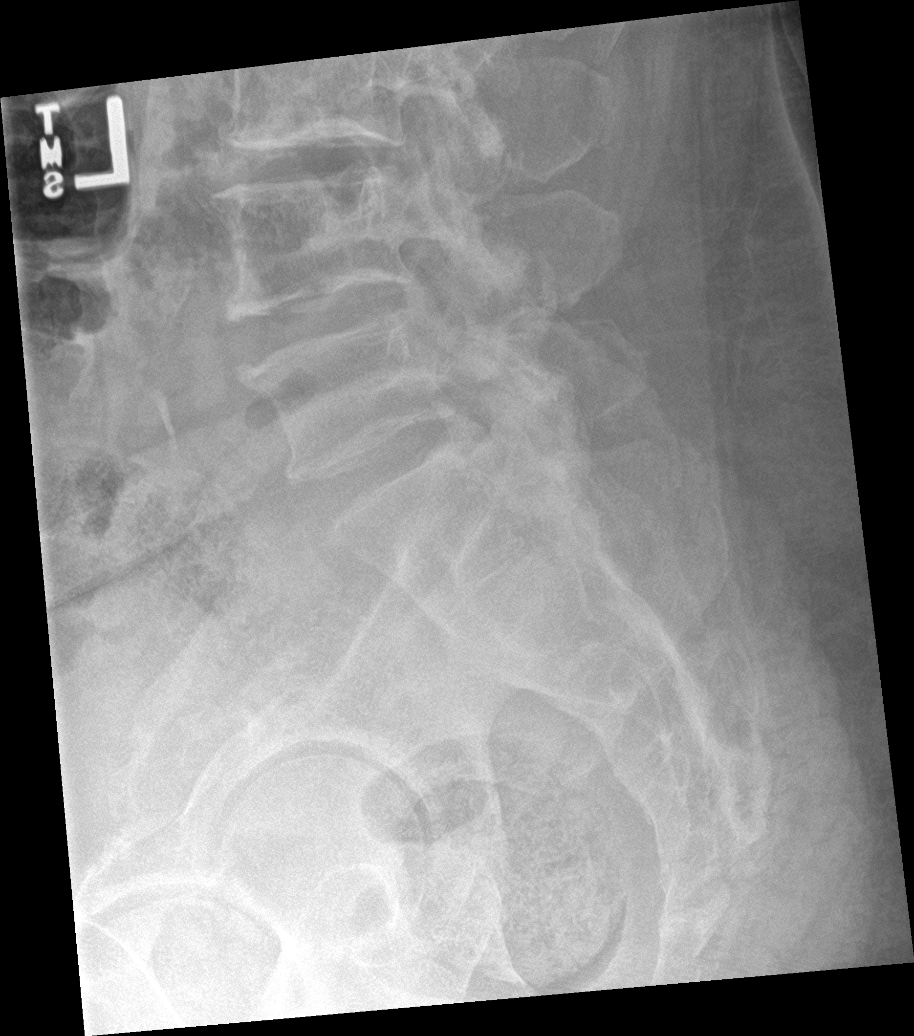

[l-spine ap]
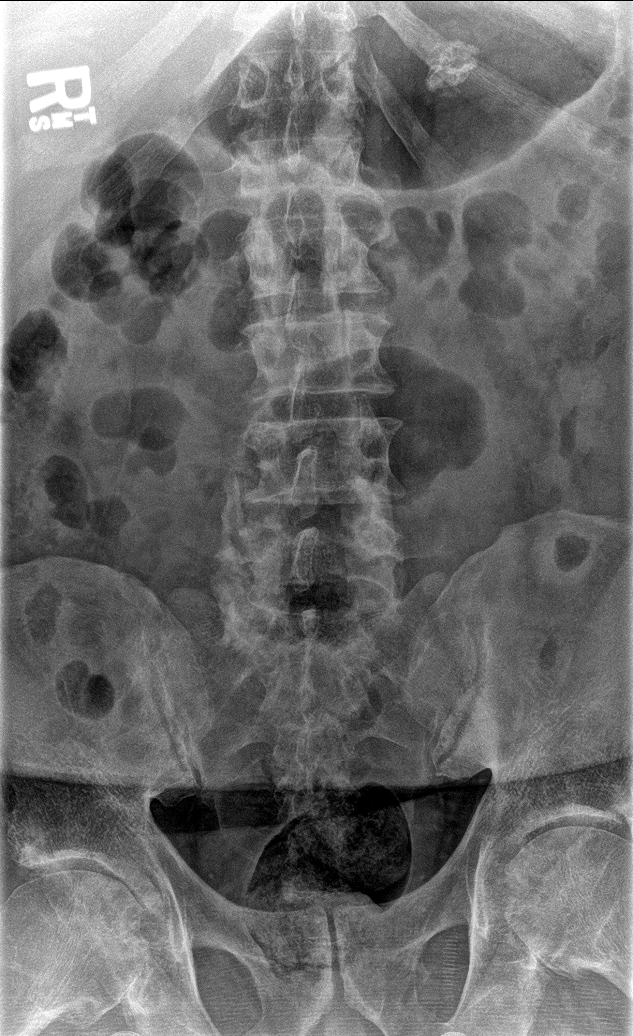

[5 of 5 positions shown; findings below may reference images not displayed]

FINDINGS: Frontal, lateral, spot lumbosacral lateral, and bilateral oblique
views were obtained. There are 5 non-rib-bearing lumbar type
vertebral bodies. There is no fracture or spondylolisthesis. There
is mild disc space narrowing at L1-2. Other disc spaces appear
unremarkable. There are anterior osteophytes at all levels. There is
facet osteoarthritic change at L4-5 and L5-S1 bilaterally. Bony
overgrowth is noted laterally on the right from L4-S1.

There is focal calcification in the left upper abdomen measuring
x 2.0 cm.
IMPRESSION: Areas of osteoarthritic change in the lower lumbar region. No
fracture or spondylolisthesis.

Calcification in the left upper quadrant measuring 2.1 x 2.0 cm of
uncertain etiology. Question calcified thrombosed splenic artery
aneurysm given location.

ADDENDUM:
Comment: There is fairly severe osteoarthritic change in both hip
joints.

*** End of Addendum ***
FINDINGS: Frontal, lateral, spot lumbosacral lateral, and bilateral oblique
views were obtained. There are 5 non-rib-bearing lumbar type
vertebral bodies. There is no fracture or spondylolisthesis. There
is mild disc space narrowing at L1-2. Other disc spaces appear
unremarkable. There are anterior osteophytes at all levels. There is
facet osteoarthritic change at L4-5 and L5-S1 bilaterally. Bony
overgrowth is noted laterally on the right from L4-S1.

There is focal calcification in the left upper abdomen measuring
x 2.0 cm.
IMPRESSION: Areas of osteoarthritic change in the lower lumbar region. No
fracture or spondylolisthesis.

Calcification in the left upper quadrant measuring 2.1 x 2.0 cm of
uncertain etiology. Question calcified thrombosed splenic artery
aneurysm given location.

## 2020-08-07 IMAGING — DX DG CHEST 2V
2 series · 2 of 2 positions shown · non-contrast
Comparison: 10/01/2019

CLINICAL DATA: Broken ribs

EXAM:
CHEST - 2 VIEW

[chest pa]
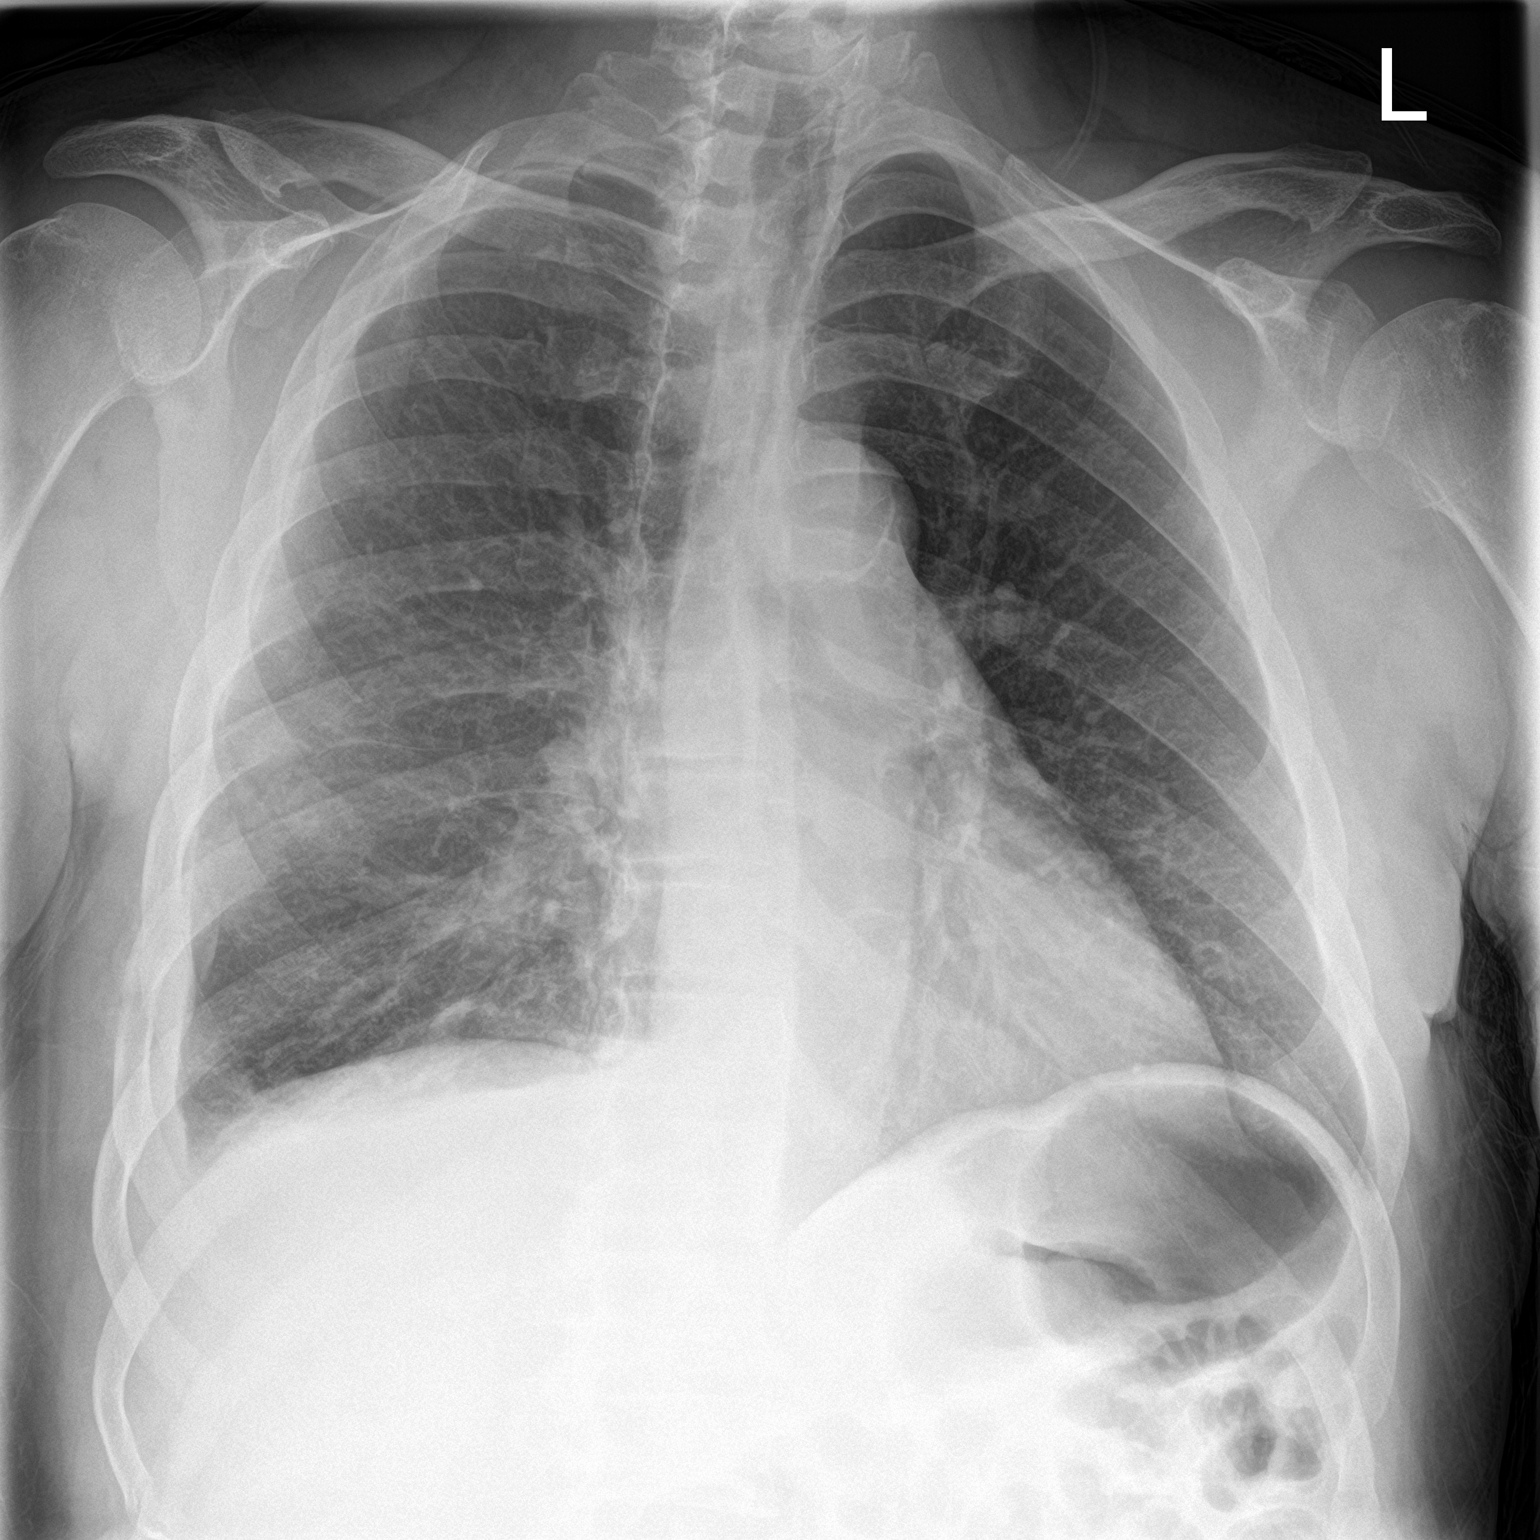

[chest lat]
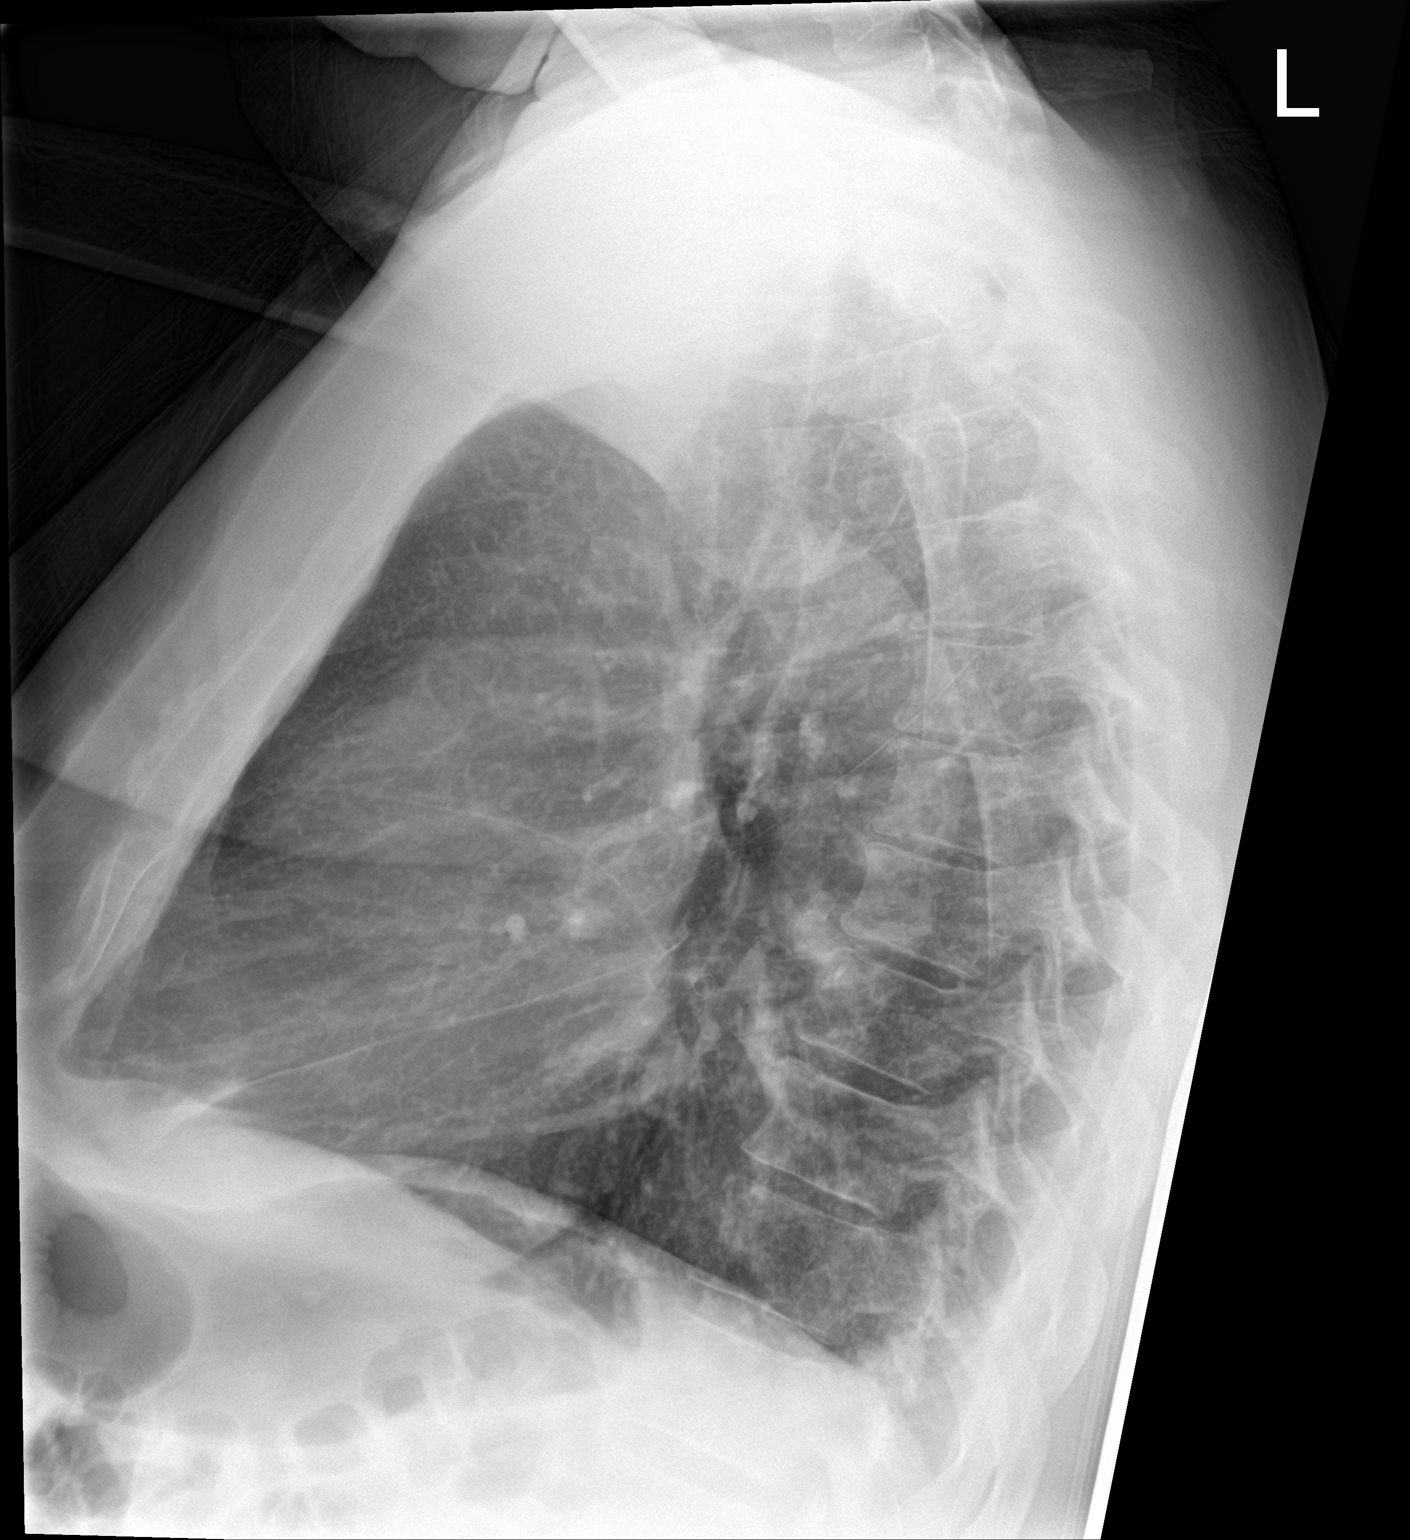

[2 of 2 positions shown; findings below may reference images not displayed]

FINDINGS: The heart size and mediastinal contours are within normal limits.
Small right pleural effusion and/or pleural thickening. The
visualized skeletal structures are unremarkable.
IMPRESSION: Small right pleural effusion and/or pleural thickening. There are no
displaced rib fractures appreciated by chest radiographs.
